# Patient Record
Sex: Male | Born: 1980 | Race: White | Hispanic: No | Marital: Married | Smoking: Never smoker
Health system: Southern US, Community
[De-identification: ages and names within clinical notes are randomized; demographics above are authoritative.]

---

## 2020-03-26 ENCOUNTER — Emergency Department (INDEPENDENT_AMBULATORY_CARE_PROVIDER_SITE_OTHER): Payer: BC Managed Care – PPO

## 2020-03-26 ENCOUNTER — Emergency Department
Admission: RE | Admit: 2020-03-26 | Discharge: 2020-03-26 | Disposition: A | Discharge status: Home / Self Care | Payer: BC Managed Care – PPO | Source: Ambulatory Visit | Attending: Family Medicine | Admitting: Family Medicine

## 2020-03-26 ENCOUNTER — Other Ambulatory Visit: Payer: Self-pay

## 2020-03-26 VITALS — BP 131/76 | HR 88 | Temp 98.3°F | Resp 18

## 2020-03-26 DIAGNOSIS — Z87828 Personal history of other (healed) physical injury and trauma: Secondary | ICD-10-CM

## 2020-03-26 DIAGNOSIS — W19XXXA Unspecified fall, initial encounter: Secondary | ICD-10-CM

## 2020-03-26 DIAGNOSIS — M79644 Pain in right finger(s): Secondary | ICD-10-CM | POA: Diagnosis not present

## 2020-03-26 DIAGNOSIS — M76822 Posterior tibial tendinitis, left leg: Secondary | ICD-10-CM

## 2020-03-26 DIAGNOSIS — Z8739 Personal history of other diseases of the musculoskeletal system and connective tissue: Secondary | ICD-10-CM

## 2020-03-26 DIAGNOSIS — M25572 Pain in left ankle and joints of left foot: Secondary | ICD-10-CM

## 2020-03-26 NOTE — ED Provider Notes (Signed)
[facility]    CSN: [id number] Arrival date & time: 03/26/20  1356      History   Chief Complaint Chief Complaint  Patient presents with  . Appointment    2pm  . Foot Pain    LT  . Hand Pain    RT    HPI Ryan Deleon is a 39 y.o. male.   Five days ago after work patient noticed soreness in the medial aspect of his left ankle.  He recalls no injury or change in activities.  The discomfort improved, but today he awoke with increased pain and a sensation of localized warmth.  He has pain with walking and weight bearing.  He also complains of pain in his right middle finger.  He recalls falling on his right hand two months ago, and one week later experienced a crush-like injury to his right middle finger.  He has had persistent pain and decreased range of motion at the PIP joint.  He is concerned that he may have fractured his finger.  The history is provided by the patient.  Ankle Pain Location:  Ankle Time since incident:  5 days Ankle location:  L ankle Pain details:    Quality:  Aching   Radiates to:  Does not radiate   Severity:  Moderate   Onset quality:  Gradual   Duration:  5 days   Timing:  Constant   Progression:  Worsening Chronicity:  New Prior injury to area:  No Relieved by:  Nothing Worsened by:  Bearing weight Ineffective treatments:  NSAIDs Associated symptoms: stiffness   Associated symptoms: no decreased ROM, no fatigue, no fever, no muscle weakness, no numbness and no swelling     History reviewed. No pertinent past medical history.  There are no problems to display for this patient.   History reviewed. No pertinent surgical history.     Home Medications    Prior to Admission medications   Medication Sig Start Date End Date Taking? Authorizing Provider  citalopram (CELEXA) 20 MG tablet Take 1 tablet by mouth daily. 11/04/16  Yes [provider]  buPROPion (WELLBUTRIN XL) 150 MG 24 hr tablet Take 150 mg by mouth  daily. 03/05/20   [provider]  lamoTRIgine (LAMICTAL) 100 MG tablet Take 100 mg by mouth 2 (two) times daily. 03/24/20   [provider]    Family History History reviewed. No pertinent family history.  Social History Social History   Tobacco Use  . Smoking status: Never Smoker  . Smokeless tobacco: Never Used  Vaping Use  . Vaping Use: Never used  Substance Use Topics  . Alcohol use: Not Currently  . Drug use: Not on file     Allergies   Patient has no known allergies.   Review of Systems Review of Systems  Constitutional: Negative for chills, diaphoresis, fatigue and fever.  Musculoskeletal: Positive for joint swelling and stiffness.       Pain in PIP joint of right middle finger.  Skin: Negative for color change and rash.  Psychiatric/Behavioral: Positive for sleep disturbance.  All other systems reviewed and are negative.    Physical Exam Triage Vital Signs ED Triage Vitals  Enc Vitals Group     BP 03/26/20 1426 131/76     Pulse Rate 03/26/20 1426 88     Resp 03/26/20 1426 18     Temp 03/26/20 1426 98.3 F (36.8 C)     Temp Source 03/26/20 1426 Oral  SpO2 03/26/20 1426 97 %     Weight --      Height --      Head Circumference --      Peak Flow --      Pain Score 03/26/20 1427 5     Pain Loc --      Pain Edu? --      Excl. in GC? --    No data found.  Updated Vital Signs BP 131/76 (BP Location: Right Arm)   Pulse 88   Temp 98.3 F (36.8 C) (Oral)   Resp 18   SpO2 97%   Visual Acuity Right Eye Distance:   Left Eye Distance:   Bilateral Distance:    Right Eye Near:   Left Eye Near:    Bilateral Near:     Physical Exam Vitals and nursing note reviewed.  Constitutional:      General: He is not in acute distress. HENT:     Head: Atraumatic.  Eyes:     Pupils: Pupils are equal, round, and reactive to light.  Cardiovascular:     Rate and Rhythm: Normal rate.  Pulmonary:     Effort: Pulmonary effort is normal.    Musculoskeletal:       Hands:     Cervical back: Normal range of motion.     Left ankle: No swelling. Tenderness present. Normal range of motion.       Feet:     Comments: Left foot has tenderness to palpation over posterior tibial tendon extending into arch.  Pain elicited by resisted plantar flexion and resisted inversion of the ankle.  Right third finger has tenderness to palpation over the PIP joint, especially over the collateral ligaments.  Joint stable however.  Patient has pain with full flexion.  Distal neurovascular function is intact.    Skin:    General: Skin is warm and dry.     Findings: No rash.  Neurological:     Mental Status: He is alert.      UC Treatments / Results  Labs (all labs ordered are listed, but only abnormal results are displayed) Labs Reviewed - No data to display  EKG   Radiology DG Ankle Complete Left  Result Date: 03/26/2020 CLINICAL DATA:  Left medial ankle pain for 5 days, no known injury EXAM: LEFT ANKLE COMPLETE - 3+ VIEW COMPARISON:  None. FINDINGS: At most trace ankle effusion. No significant soft tissue swelling. No soft tissue gas or foreign body. No acute bony abnormality. Specifically, no fracture, subluxation, or dislocation. IMPRESSION: At most trace ankle effusion. No significant swelling or acute osseous abnormality. Electronically Signed   By: Kreg Shropshire M.D.   On: 03/26/2020 16:10   DG Finger Middle Right  Result Date: 03/26/2020 CLINICAL DATA:  Crushing injury 2 months prior with persistent PIP pain EXAM: RIGHT MIDDLE FINGER 2+V COMPARISON:  None. FINDINGS: No acute healing or healed posttraumatic findings of the third ray of the third ray. Soft tissues are unremarkable, without soft tissue gas or foreign body. IMPRESSION: Negative. Electronically Signed   By: Kreg Shropshire M.D.   On: 03/26/2020 16:09    Procedures Procedures (including critical care time)  Medications Ordered in UC Medications - No data to  display  Initial Impression / Assessment and Plan / UC Course  I have reviewed the triage vital signs and the nursing notes.  Pertinent labs & imaging results that were available during my care of the patient were reviewed by me and considered  in my medical decision making (see chart for details).    Dispensed Air[employer] stirrup splint. Followup with sports medicine for further evaluation/management.   Final Clinical Impressions(s) / UC Diagnoses   Final diagnoses:  Posterior tibial tendonitis, left  History of sprain of interphalangeal joint of finger     Discharge Instructions     Apply ice pack for 20 to 30 minutes, 3 to 4 times daily  Continue until pain and swelling decrease.  May take Ibuprofen 200mg , 4 tabs every 8 hours with food.  Wear ankle stirrup splint until improved.  Begin range of motion and stretching exercises.    ED Prescriptions    None        , MD 03/29/20 0600

## 2020-03-26 NOTE — Discharge Instructions (Addendum)
Apply ice pack for 20 to 30 minutes, 3 to 4 times daily  Continue until pain and swelling decrease.  May take Ibuprofen 200mg , 4 tabs every 8 hours with food.  Wear ankle stirrup splint until improved.  Begin range of motion and stretching exercises.

## 2020-03-26 NOTE — ED Triage Notes (Signed)
Pt c/o lateral LT foot/ankle pain since this morning. Says he woke up to it feeling warm and painful, hurt to bare weight. Also c/o RT middle finger pain. Says he thinks he broke it two months ago and never did get checked out. Ibuprofen prn.

## 2020-04-02 ENCOUNTER — Encounter: Payer: Self-pay | Admitting: Family Medicine

## 2020-04-02 ENCOUNTER — Ambulatory Visit: Payer: Self-pay

## 2020-04-02 ENCOUNTER — Other Ambulatory Visit: Payer: Self-pay

## 2020-04-02 ENCOUNTER — Ambulatory Visit: Payer: BC Managed Care – PPO | Admitting: Family Medicine

## 2020-04-02 ENCOUNTER — Telehealth: Payer: Self-pay | Admitting: Family Medicine

## 2020-04-02 VITALS — BP 129/88 | HR 77 | Ht 70.0 in | Wt 140.0 lb

## 2020-04-02 DIAGNOSIS — M84375A Stress fracture, left foot, initial encounter for fracture: Secondary | ICD-10-CM | POA: Insufficient documentation

## 2020-04-02 DIAGNOSIS — M25572 Pain in left ankle and joints of left foot: Secondary | ICD-10-CM

## 2020-04-02 DIAGNOSIS — M76822 Posterior tibial tendinitis, left leg: Secondary | ICD-10-CM | POA: Diagnosis not present

## 2020-04-02 MED ORDER — PREDNISONE 5 MG PO TABS: ORAL_TABLET | ORAL | 0 refills | Status: AC

## 2020-04-02 NOTE — Assessment & Plan Note (Signed)
Pain occurring for the past week with no specific inciting event.  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  Mild changes at the insertion. -Counseled on home exercise therapy and supportive care. -Cam walker with scaphoid pad. -Prednisone. -Could consider injection or physical therapy or lab work

## 2020-04-02 NOTE — Patient Instructions (Signed)
Nice to meet you Please try the prednisone  Please use the boot  Please send me a message in MyChart with any questions or updates.  Please see me back in 2 weeks.   --Dr. Jordan Likes

## 2020-04-02 NOTE — Telephone Encounter (Signed)
Patient called back after returning to work, says Associate Professor required a RTW letter frm provider w/ listed Work Restrictions due to him having to wear the boot.  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

## 2020-04-02 NOTE — Progress Notes (Signed)
Ryan Deleon - 39 y.o. male MRN [id number]  Date of birth: 1981-04-22  SUBJECTIVE:  Including CC & ROS.  Chief Complaint  Patient presents with  . Foot Pain    left x 10 days    Ryan Deleon is a 39 y.o. male that is presenting with left ankle pain.  The pain is occurring over the medial foot.  Denies any specific inciting event.  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  No history of similar pain.  No history of surgery.  Has mild improvement with ibuprofen..  Independent review of the left ankle x-ray from 10/26 shows no acute or normalities.  Independent review of the right middle finger from 10/26 shows no acute abnormalities.   Review of Systems See HPI   HISTORY: Past Medical, Surgical, Social, and Family History Reviewed & Updated per EMR.   Pertinent Historical Findings include:  No past medical history on file.  No past surgical history on file.  No family history on file.  Social History   Socioeconomic History  . Marital status: Married    Spouse name: Not on file  . Number of children: Not on file  . Years of education: Not on file  . Highest education level: Not on file  Occupational History  . Not on file  Tobacco Use  . Smoking status: Never Smoker  . Smokeless tobacco: Never Used  Vaping Use  . Vaping Use: Never used  Substance and Sexual Activity  . Alcohol use: Not Currently  . Drug use: Not on file  . Sexual activity: Not on file  Other Topics Concern  . Not on file  Social History Narrative  . Not on file   Social Determinants of Health   Financial Resource Strain:   . Difficulty of Paying Living Expenses: Not on file  Food Insecurity:   . Worried About Programme researcher, broadcasting/film/video in the Last Year: Not on file  . Ran Out of Food in the Last Year: Not on file  Transportation Needs:   . Lack of Transportation (Medical): Not on file  . Lack of Transportation (Non-Medical): Not on file  Physical Activity:   . Days of  Exercise per Week: Not on file  . Minutes of Exercise per Session: Not on file  Stress:   . Feeling of Stress : Not on file  Social Connections:   . Frequency of Communication with Friends and Family: Not on file  . Frequency of Social Gatherings with Friends and Family: Not on file  . Attends Religious Services: Not on file  . Active Member of Clubs or Organizations: Not on file  . Attends Banker Meetings: Not on file  . Marital Status: Not on file  Intimate Partner Violence:   . Fear of Current or Ex-Partner: Not on file  . Emotionally Abused: Not on file  . Physically Abused: Not on file  . Sexually Abused: Not on file     PHYSICAL EXAM:  VS: BP 129/88   Pulse 77   Ht 5\' 10"  (1.778 m)   Wt 140 lb (63.5 kg)   BMI 20.09 kg/m  Physical Exam Gen: NAD, alert, cooperative with exam, well-appearing MSK:  Left ankle/foot: Tenderness to palpation of the posterior tibialis. Swelling of the navicular. Normal strength resistance. Some laxity longitudinal arch demonstrating a planus foot. Neurovascularly intact  Limited ultrasound: Left ankle/foot:  Normal-appearing posterior tibialis at the medial malleolus. Mild effusion in the deep portion  of the posterior tibialis with no hyperemia. Increased hyperemia at the insertion of the navicular which could possibly point to new these neuropathy. No effusion in the ankle joint  Summary: Findings suggest posterior tibialis insertional tendinitis  Ultrasound and interpretation by Clare Gandy, MD    ASSESSMENT & PLAN:   Posterior tibial tendinitis of left lower extremity Pain occurring for the past week with no specific inciting event.  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  Mild changes at the insertion. -Counseled on home exercise therapy and supportive care. -Cam walker with scaphoid pad. -Prednisone. -Could consider injection or physical therapy or lab  work

## 2020-04-16 ENCOUNTER — Other Ambulatory Visit: Payer: Self-pay

## 2020-04-16 ENCOUNTER — Encounter: Payer: Self-pay | Admitting: Family Medicine

## 2020-04-16 ENCOUNTER — Ambulatory Visit: Payer: BC Managed Care – PPO | Admitting: Family Medicine

## 2020-04-16 DIAGNOSIS — M84375A Stress fracture, left foot, initial encounter for fracture: Secondary | ICD-10-CM | POA: Diagnosis not present

## 2020-04-16 NOTE — Progress Notes (Signed)
Ferd Horrigan - 39 y.o. male MRN [id number]  Date of birth: 1980-09-21  SUBJECTIVE:  Including CC & ROS.  Chief Complaint  Patient presents with  . Follow-up    left foot    Hassan Blackshire is a 39 y.o. male that is following up for her left foot pain. Has ongoing pain of his left foot. Has noticed some improvement with the CAM walker and then no improvement at other times. Initially started after walking for more than he normal does. No improvement with prednisone.    Review of Systems See HPI   HISTORY: Past Medical, Surgical, Social, and Family History Reviewed & Updated per EMR.   Pertinent Historical Findings include:  No past medical history on file.  No past surgical history on file.  No family history on file.  Social History   Socioeconomic History  . Marital status: Married    Spouse name: Not on file  . Number of children: Not on file  . Years of education: Not on file  . Highest education level: Not on file  Occupational History  . Not on file  Tobacco Use  . Smoking status: Never Smoker  . Smokeless tobacco: Never Used  Vaping Use  . Vaping Use: Never used  Substance and Sexual Activity  . Alcohol use: Not Currently  . Drug use: Not on file  . Sexual activity: Not on file  Other Topics Concern  . Not on file  Social History Narrative  . Not on file   Social Determinants of Health   Financial Resource Strain:   . Difficulty of Paying Living Expenses: Not on file  Food Insecurity:   . Worried About Programme researcher, broadcasting/film/video in the Last Year: Not on file  . Ran Out of Food in the Last Year: Not on file  Transportation Needs:   . Lack of Transportation (Medical): Not on file  . Lack of Transportation (Non-Medical): Not on file  Physical Activity:   . Days of Exercise per Week: Not on file  . Minutes of Exercise per Session: Not on file  Stress:   . Feeling of Stress : Not on file  Social Connections:   . Frequency of Communication with Friends and  Family: Not on file  . Frequency of Social Gatherings with Friends and Family: Not on file  . Attends Religious Services: Not on file  . Active Member of Clubs or Organizations: Not on file  . Attends Banker Meetings: Not on file  . Marital Status: Not on file  Intimate Partner Violence:   . Fear of Current or Ex-Partner: Not on file  . Emotionally Abused: Not on file  . Physically Abused: Not on file  . Sexually Abused: Not on file     PHYSICAL EXAM:  VS: BP 118/79   Pulse 73   Ht 5\' 10"  (1.778 m)   Wt 140 lb (63.5 kg)   BMI 20.09 kg/m  Physical Exam Gen: NAD, alert, cooperative with exam, well-appearing MSK:  Left foot:  Tenderness to palpation at the navicular  Pain with standing  Normal ankle range of motion  Neurovascularly intact    ASSESSMENT & PLAN:   Stress fracture of navicular bone of left foot Pain is intermittent and severe at times. CAM walker doesn't seem to help at times. He first noticed the pain after walking more than he normally does.  - counseled on supportive care - continue CAM walker  - MRi of the ankle to evaluate  for navicular stress fracture vs posterior tibialis tear.

## 2020-04-16 NOTE — Assessment & Plan Note (Signed)
Pain is intermittent and severe at times. CAM walker doesn't seem to help at times. He first noticed the pain after walking more than he normally does.  - counseled on supportive care - continue CAM walker  - MRi of the ankle to evaluate for navicular stress fracture vs posterior tibialis tear.

## 2020-04-16 NOTE — Patient Instructions (Signed)
Good to see you Please continue the boot  They will call to schedule the MRI   Please send me a message in MyChart with any questions or updates.  We will schedule a virtual visit once the MRI is resulted.   --Dr. Jordan Likes

## 2021-09-28 IMAGING — DX DG FINGER MIDDLE 2+V*R*
3 series · 3 of 3 positions shown · non-contrast
Comparison: None.

CLINICAL DATA: Crushing injury 2 months prior with persistent PIP
pain

EXAM:
RIGHT MIDDLE FINGER 2+V

[finger ap]
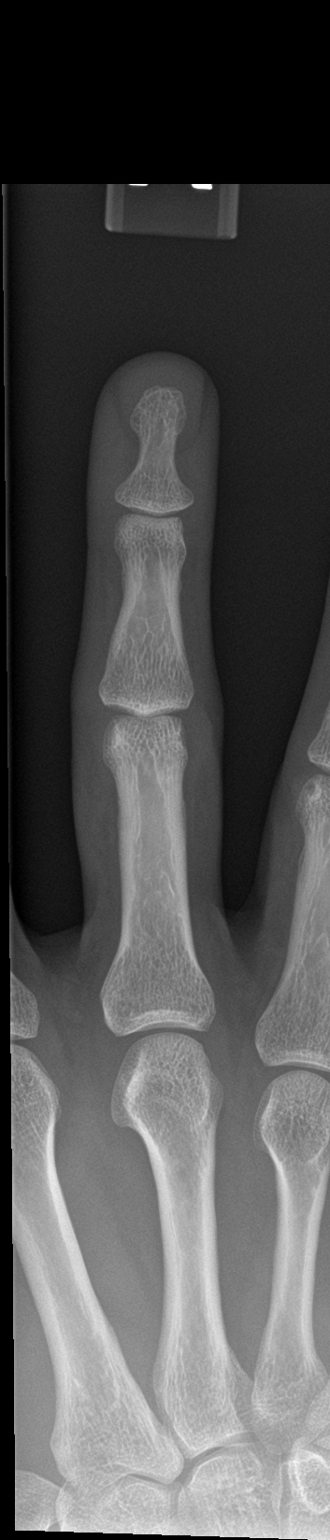

[finger obl]
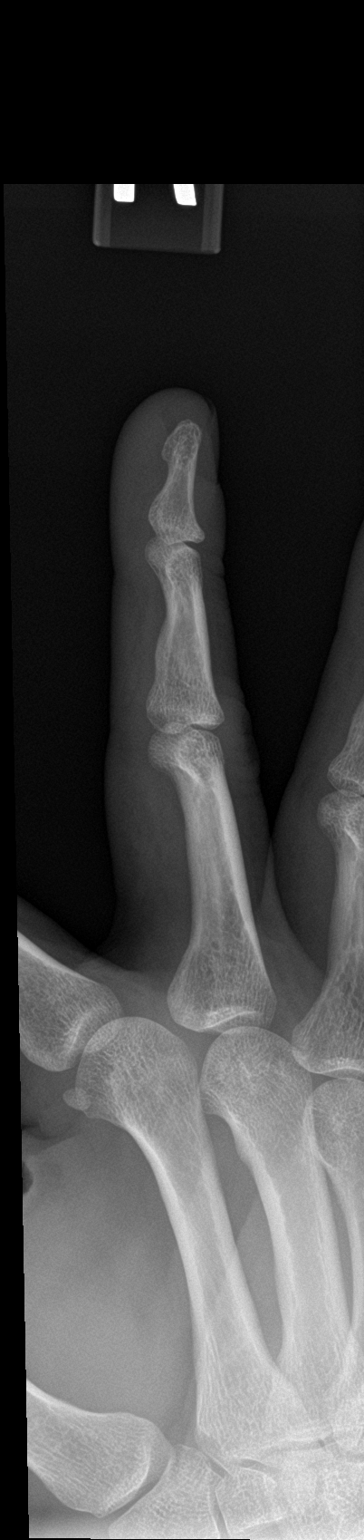

[finger lat]
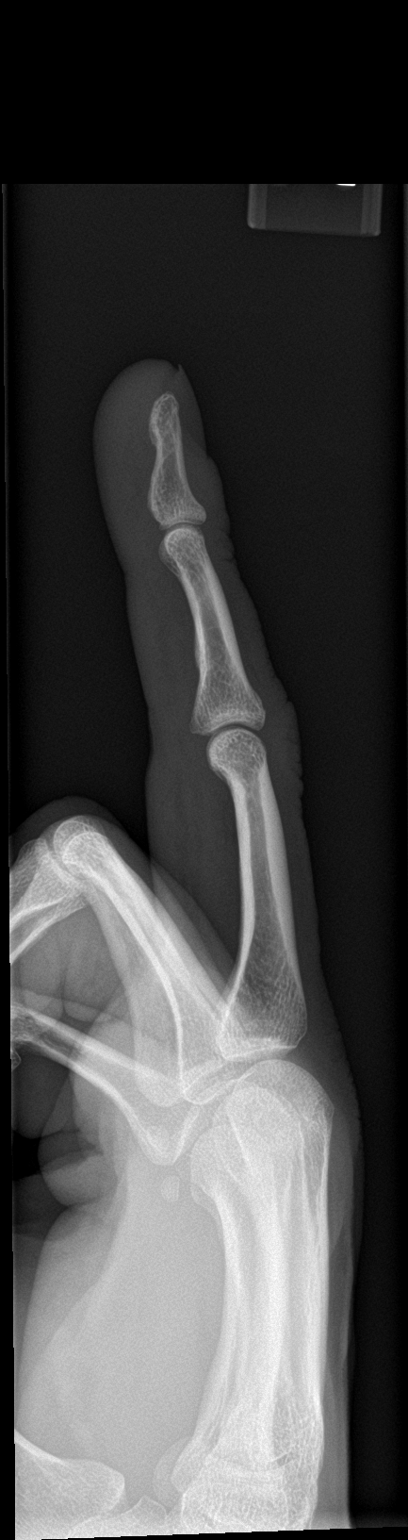

[3 of 3 positions shown; findings below may reference images not displayed]

FINDINGS: No acute healing or healed posttraumatic findings of the third ray
of the third ray. Soft tissues are unremarkable, without soft tissue
gas or foreign body.
IMPRESSION: Negative.

## 2022-09-14 ENCOUNTER — Encounter: Payer: Self-pay | Admitting: *Deleted
